# Patient Record
Sex: Female | Born: 1990 | Race: Black or African American | Hispanic: No | Marital: Single | Smoking: Never smoker
Health system: Southern US, Community
[De-identification: ages and names within clinical notes are randomized; demographics above are authoritative.]

## PROBLEM LIST (undated history)

## (undated) DIAGNOSIS — E282 Polycystic ovarian syndrome: Secondary | ICD-10-CM

## (undated) DIAGNOSIS — J45909 Unspecified asthma, uncomplicated: Secondary | ICD-10-CM

## (undated) DIAGNOSIS — T7840XA Allergy, unspecified, initial encounter: Secondary | ICD-10-CM

## (undated) HISTORY — PX: OTHER SURGICAL HISTORY: SHX169

## (undated) HISTORY — DX: Unspecified asthma, uncomplicated: J45.909

## (undated) HISTORY — DX: Allergy, unspecified, initial encounter: T78.40XA

---

## 2015-06-13 ENCOUNTER — Ambulatory Visit (INDEPENDENT_AMBULATORY_CARE_PROVIDER_SITE_OTHER): Payer: Federal, State, Local not specified - PPO | Admitting: Family Medicine

## 2015-06-13 VITALS — BP 130/70 | HR 81 | Temp 99.3°F | Ht 64.0 in | Wt 213.0 lb

## 2015-06-13 DIAGNOSIS — Z3041 Encounter for surveillance of contraceptive pills: Secondary | ICD-10-CM | POA: Diagnosis not present

## 2015-06-13 MED ORDER — NORGESTIM-ETH ESTRAD TRIPHASIC 0.18/0.215/0.25 MG-35 MCG PO TABS: 1.0000 | ORAL_TABLET | Freq: Every day | ORAL | Status: DC

## 2015-06-13 MED ORDER — NORGESTIM-ETH ESTRAD TRIPHASIC 0.18/0.215/0.25 MG-35 MCG PO TABS: 1.0000 | ORAL_TABLET | Freq: Every day | ORAL | Status: AC

## 2015-06-13 NOTE — Progress Notes (Signed)
Birth control pills Subjective:  Patient ID: Angel Mullen, female    DOB: September 22, 1991  Age: 24 y.o. MRN: [id number]  Patient thought she had another pack of birth control pills and will run out this weekend and her physician is not available. She needs a new prescription. She is otherwise healthy. Has a history of some asthma. No other acute problems. She had a question about whether Provera and albuterol inhalers with the same.   Objective:   No acute distress. No thyromegaly. Chest clear. Heart regular without murmurs. Not currently sexually active.  Assessment & Plan:   Assessment:  Oral contraception surveillance  Plan:  Continue the current pills. She will need to follow-up with her primary but I gave her a couple of months refill.  Patient Instructions  Continue current contraception.  Follow-up with your primary care.     Analysa Nutting, MD 06/13/2015

## 2015-06-13 NOTE — Patient Instructions (Signed)
Continue current contraception.  Follow-up with your primary care.

## 2015-08-17 ENCOUNTER — Ambulatory Visit (INDEPENDENT_AMBULATORY_CARE_PROVIDER_SITE_OTHER): Payer: Federal, State, Local not specified - PPO | Admitting: Family Medicine

## 2015-08-17 VITALS — BP 134/68 | HR 86 | Temp 98.6°F | Resp 16 | Ht 60.25 in | Wt 211.2 lb

## 2015-08-17 DIAGNOSIS — J3489 Other specified disorders of nose and nasal sinuses: Secondary | ICD-10-CM | POA: Diagnosis not present

## 2015-08-17 DIAGNOSIS — R059 Cough, unspecified: Secondary | ICD-10-CM

## 2015-08-17 DIAGNOSIS — R05 Cough: Secondary | ICD-10-CM

## 2015-08-17 DIAGNOSIS — J9801 Acute bronchospasm: Secondary | ICD-10-CM | POA: Diagnosis not present

## 2015-08-17 MED ORDER — ALBUTEROL SULFATE HFA 108 (90 BASE) MCG/ACT IN AERS: 2.0000 | INHALATION_SPRAY | Freq: Four times a day (QID) | RESPIRATORY_TRACT | Status: AC | PRN

## 2015-08-17 MED ORDER — AZITHROMYCIN 250 MG PO TABS: ORAL_TABLET | ORAL | Status: DC

## 2015-08-17 MED ORDER — PREDNISONE 20 MG PO TABS: ORAL_TABLET | ORAL | Status: DC

## 2015-08-17 NOTE — Progress Notes (Signed)
[facility] [address], [place] [address] [phone]  Date:  08/17/2015   Name:  Angel DollyCamilla Mcanany   DOB:  Feb 07, 1991   MRN:  [id number]  PCP:  No primary care provider on file.    Chief Complaint: Cough; Nasal Congestion; Sore Throat; and Generalized Body Aches   History of Present Illness:  Angel Mullen is a 24 y.o. very pleasant female patient who presents with the following:  She has noted a cough for about a month, mild ear pains. She has a history of frequent OM Last night she noted a ST and itchy throat, feels like she has some pressure in her sinuses.   She also has noted some aches and chills She continues to have the cough  She has not noted a fever at home No GI symptoms  She has noted some wheezing in the morning and at night; she does have asthma.  She uses singulair and prn albuterol.    She has tried some mucinex yesterday am; no antipyretics today.    LMP about 10 days ago Never a smoker There are no active problems to display for this patient.   Past Medical History  Diagnosis Date  . Allergy   . Asthma     Past Surgical History  Procedure Laterality Date  . 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 Smoking status: Never Smoker   . Smokeless tobacco: None  . Alcohol Use: 2.4 oz/week    4 Standard drinks or equivalent per week    No family history on file.  Allergies  Allergen Reactions  . Amoxicillin Hives and Diarrhea    Medication list has been reviewed and updated.  Current Outpatient Prescriptions on File Prior to Visit  Medication Sig Dispense Refill  . Norgestimate-Ethinyl Estradiol Triphasic (TRINESSA, 28,) 0.18/0.215/0.25 MG-35 MCG tablet Take 1 tablet by mouth daily. 1 Package 2   No current facility-administered medications on file prior to visit.    Review of Systems:  As per HPI- otherwise negative.   Physical Examination: Filed Vitals:   08/17/15 1228   BP: 134/68  Pulse: 86  Temp: 98.6 F (37 C)  Resp: 16   Filed Vitals:   08/17/15 1228  Height: 5' 0.25" (1.53 m)  Weight: 211 lb 3.2 oz (95.8 kg)   Body mass index is 40.92 kg/(m^2). Ideal Body Weight: Weight in (lb) to have BMI = 25: 128.8  GEN: WDWN, NAD, Non-toxic, A & O x 3, overweight, looks well HEENT: Atraumatic, Normocephalic. Neck supple. No masses, No LAD.  Bilateral TM wnl, oropharynx normal.  PEERL,EOMI.   No sinus tenderness to percussion Ears and Nose: No external deformity. CV: RRR, No M/G/R. No JVD. No thrill. No extra heart sounds. PULM: CTA B, no wheezes, crackles, rhonchi. No retractions. No resp. distress. No accessory muscle use. EXTR: No c/c/e NEURO Normal gait.  PSYCH: Normally interactive. Conversant. Not depressed or anxious appearing.  Calm demeanor.    Assessment and Plan: Bronchospasm - Plan: albuterol (PROVENTIL HFA;VENTOLIN HFA) 108 (90 BASE) MCG/ACT inhaler  Cough - Plan: albuterol (PROVENTIL HFA;VENTOLIN HFA) 108 (90 BASE) MCG/ACT inhaler, azithromycin (ZITHROMAX) 250 MG tablet  Sinus pressure - Plan: predniSONE (DELTASONE) 20 MG tablet  Treat for bronchospasm and cough with a course of prednisone If not better soon will use azithromycin Refilled her albuterol She will let me know if not better soon  Signed Abbe AmsterdamJessica Copland, MD

## 2015-08-17 NOTE — Patient Instructions (Signed)
We are going to try a short course of prednisone for your cough and sinus pressure.  Use it for 6 days according to the directions  If this does not work (if you are not improving in a few days) you can also fill and use the azithromycin antibiotic rx Remember that any antibiotic can interfere with your birth control pill  Continue to use your inhaler as needed  Let me know if you are not improving son- Sooner if worse.

## 2015-08-29 ENCOUNTER — Ambulatory Visit (INDEPENDENT_AMBULATORY_CARE_PROVIDER_SITE_OTHER): Payer: Federal, State, Local not specified - PPO | Admitting: Family Medicine

## 2015-08-29 VITALS — BP 120/80 | HR 94 | Temp 99.4°F | Resp 16 | Ht 64.0 in | Wt 208.6 lb

## 2015-08-29 DIAGNOSIS — J209 Acute bronchitis, unspecified: Secondary | ICD-10-CM | POA: Diagnosis not present

## 2015-08-29 MED ORDER — PREDNISONE 5 MG PO TABS
10.0000 mg | ORAL_TABLET | Freq: Every day | ORAL | Status: DC
Start: 2015-08-29 — End: 2015-12-07

## 2015-08-29 NOTE — Patient Instructions (Signed)
Your symptoms and exam are most consistent with an irritant sinus and bronchitis syndrome probably secondary to the poor air quality. We still have good peak flow subtle that you need any more asthma medicine.  Hopefully, women's and climate change in the next week will relieve your symptoms along with the low-dose prednisone.

## 2015-08-29 NOTE — Progress Notes (Signed)
This chart was scribed for Elvina SidleKurt Bedford Winsor, MD by Watt Climesawaa Al Rifaie medical scribe at [facility].The patient was seen in exam room 11 and the patient's care was started at 9:18 AM.  Patient ID: Angel Mullen MRN: [id number], DOB: 1991/09/12, 24 y.o. Date of Encounter: 08/29/2015  Primary Physician: No primary care provider on file.  Chief Complaint:  Chief Complaint  Patient presents with  . Sinus Problem    x 2 days  . Cough    x 1 day  . Sore Throat    x 1 day   . Generalized Body Aches    x last night   . Chills    x last night   . Fatigue    x 1 day  . lack of appetite    x 1 day     HPI:  Angel Mullen is a 24 y.o. female who [facility] complaining of difficulty breathing. Pt states that she presents with breathing difficulty that gradually developed since yesterday, in addition to productive cough, decreased appetite, fatigue, chills, sinus pressure, myalgia, and sore throat that started with sudden onset last night. Pt was treated previously in 11/07 with antibiotics for similar symptoms few days ago. Pt denies pain in the legs, fever, vomiting. Pt does have a hx of Asthma and takes albuterol inhaler when needed.   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   Past Medical History  Diagnosis Date  . Allergy   . Asthma      Home Meds: Prior to Admission medications   Medication Sig Start Date End Date Taking? Authorizing Provider  albuterol (PROVENTIL HFA;VENTOLIN HFA) 108 (90 BASE) MCG/ACT inhaler Inhale 2 puffs into the lungs every 6 (six) hours as needed for wheezing or shortness of breath. Pt prefers pro-air brand 08/17/15  Yes Pearline CablesJessica C Copland, MD  azithromycin (ZITHROMAX) 250 MG tablet Use as a zpack 08/17/15  Yes Jessica C Copland, MD  montelukast (SINGULAIR) 10 MG tablet Take 10 mg by mouth at bedtime.   Yes Historical Provider, MD  Norgestimate-Ethinyl Estradiol  Triphasic (TRINESSA, 28,) 0.18/0.215/0.25 MG-35 MCG tablet Take 1 tablet by mouth daily. 06/13/15  Yes Peyton Najjaravid H Hopper, MD  predniSONE (DELTASONE) 20 MG tablet Take 2 pills a day for 3 days, then 1 pill a day for 3 days Patient not taking: Reported on 08/29/2015 08/17/15   Pearline CablesJessica C Copland, MD    Allergies:  Allergies  Allergen Reactions  . Amoxicillin Hives and Diarrhea    Social History   Social History  . Marital Status: Single    Spouse Name: N/A  . Number of Children: N/A  . Years of Education: N/A   Occupational History  . Not on file.   Social History Main Topics  . Smoking status: Never Smoker   . Smokeless tobacco: Never Used  . Alcohol Use: 2.4 oz/week    4 Standard drinks or equivalent per week  . Drug Use: No  . Sexual Activity: Not on file   Other Topics Concern  . Not on file   Social History Narrative     Review of Systems: Constitutional: negative for fever, night sweats, weight changes. Positive for decreased appetite, fatigue, chills, myalgia. HEENT: negative for vision changes, hearing loss, congestion, rhinorrhea, ST, epistaxis. Positive for sinus pressure, sore throat. Cardiovascular: negative for chest pain or palpitations Respiratory: negative for hemoptysis, wheezing, shortness of breath. Positive for productive cough,  difficulty breathing. Abdominal: negative for abdominal pain, nausea, vomiting, diarrhea, or constipation Dermatological: negative for rash Neurologic: negative for headache, dizziness, or syncope All other systems reviewed and are otherwise negative with the exception to those above and in the HPI.  Physical Exam: Blood pressure 120/80, pulse 94, temperature 99.4 F (37.4 C), temperature source Oral, resp. rate 16, height  (1.626 m), weight 208 lb 9.6 oz (94.62 kg), last menstrual period 08/07/2015, SpO2 94 %., Body mass index is 35.79 kg/(m^2). General: Well developed, well nourished, in no acute distress. Head:  Normocephalic, atraumatic, eyes without discharge, sclera non-icteric, nares are without discharge. Bilateral auditory canals clear, TM's are without perforation, pearly grey and translucent with reflective cone of light bilaterally. Oral cavity moist, posterior pharynx without exudate, erythema, peritonsillar abscess, or post nasal drip. Nasal passages are red and  Neck: Supple. No thyromegaly. Full ROM. No lymphadenopathy. Lungs: Clear bilaterally to auscultation without wheezes, rales, or rhonchi. Breathing is unlabored. Heart: RRR with S1 S2. No murmurs, rubs, or gallops appreciated. Abdomen: Soft, non-tender, non-distended with normoactive bowel sounds. No hepatomegaly. No rebound/guarding. No obvious abdominal masses. Msk:  Strength and tone normal for age. Extremities/Skin: Warm and dry. No clubbing or cyanosis. No edema. No rashes or suspicious lesions. Neuro: Alert and oriented X 3. Moves all extremities spontaneously. Gait is normal. CNII-XII grossly in tact. Psych:  Responds to questions appropriately with a normal affect.   Peak flow 390, predicted 406.    ASSESSMENT AND PLAN:  24 y.o. year old female with     By signing my name below, I, Rawaa Al Rifaie, attest that this documentation has been prepared under the direction and in the presence of Elvina Sidle, MD.  Watt Climes Rifaie, Medical Scribe. 08/29/2015.  9:31 AM.  This chart was scribed in my presence and reviewed by me personally.    ICD-9-CM ICD-10-CM   1. Acute bronchitis, unspecified organism 466.0 J20.9 predniSONE (DELTASONE) 5 MG tablet     Signed, Elvina Sidle, MD  Signed, Elvina Sidle, MD 08/29/2015 9:18 AM

## 2015-08-31 ENCOUNTER — Encounter: Payer: Self-pay | Admitting: Family Medicine

## 2015-09-11 ENCOUNTER — Other Ambulatory Visit: Payer: Self-pay | Admitting: Obstetrics & Gynecology

## 2015-09-11 ENCOUNTER — Other Ambulatory Visit (HOSPITAL_COMMUNITY)
Admission: RE | Admit: 2015-09-11 | Discharge: 2015-09-11 | Disposition: A | Discharge status: Home / Self Care | Payer: Federal, State, Local not specified - PPO | Source: Ambulatory Visit | Attending: Obstetrics & Gynecology | Admitting: Obstetrics & Gynecology

## 2015-09-11 DIAGNOSIS — Z113 Encounter for screening for infections with a predominantly sexual mode of transmission: Secondary | ICD-10-CM | POA: Insufficient documentation

## 2015-09-11 DIAGNOSIS — Z01419 Encounter for gynecological examination (general) (routine) without abnormal findings: Secondary | ICD-10-CM | POA: Insufficient documentation

## 2015-09-14 LAB — CYTOLOGY - PAP

## 2015-12-07 ENCOUNTER — Ambulatory Visit (INDEPENDENT_AMBULATORY_CARE_PROVIDER_SITE_OTHER): Payer: Federal, State, Local not specified - PPO | Admitting: Family Medicine

## 2015-12-07 VITALS — BP 124/84 | HR 76 | Temp 99.1°F | Resp 16 | Ht 64.0 in | Wt 205.0 lb

## 2015-12-07 DIAGNOSIS — J029 Acute pharyngitis, unspecified: Secondary | ICD-10-CM | POA: Diagnosis not present

## 2015-12-07 DIAGNOSIS — Z20828 Contact with and (suspected) exposure to other viral communicable diseases: Secondary | ICD-10-CM

## 2015-12-07 DIAGNOSIS — J039 Acute tonsillitis, unspecified: Secondary | ICD-10-CM

## 2015-12-07 LAB — POCT RAPID STREP A (OFFICE): Rapid Strep A Screen: NEGATIVE

## 2015-12-07 MED ORDER — AZITHROMYCIN 250 MG PO TABS: ORAL_TABLET | ORAL | Status: DC

## 2015-12-07 MED ORDER — METHYLPREDNISOLONE 4 MG PO TBPK: ORAL_TABLET | ORAL | Status: DC

## 2015-12-07 NOTE — Patient Instructions (Signed)

## 2015-12-07 NOTE — Progress Notes (Signed)
 Chief Complaint:  Chief Complaint  Patient presents with  . Nausea  . Sore Throat    Pt states white spots on tonsils  . Asthma  . Ear Fullness    Bilateral    HPI: Angel Mullen is a 25 y.o. female who [facility] to [facility] today complaining of yesterday afternoon noticed spots on tonsils and it is raw, her ears are hurting, her asthma was worse yesterday, nauseated last night and tired .  No fevers or chills. 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 No fevers, chills.   Past Medical History  Diagnosis Date  . Allergy   . Asthma    Past Surgical History  Procedure Laterality Date  . 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 Marital Status: Single    Spouse Name: N/A  . Number of Children: N/A  . Years of Education: N/A   Social History Main Topics  . Smoking status: Never Smoker   . Smokeless tobacco: Never Used  . Alcohol Use: 2.4 oz/week    4 Standard drinks or equivalent per week  . Drug Use: No  . Sexual Activity: Not Asked   Other Topics Concern  . None   Social History Narrative   History reviewed. No pertinent family history. Allergies  Allergen Reactions  . Amoxicillin Hives and Diarrhea   Prior to Admission medications   Medication Sig Start Date End Date Taking? Authorizing Provider  albuterol (PROVENTIL HFA;VENTOLIN HFA) 108 (90 BASE) MCG/ACT inhaler Inhale 2 puffs into the lungs every 6 (six) hours as needed for wheezing or shortness of breath. Pt prefers pro-air brand 08/17/15  Yes Jessica C Copland, MD  montelukast (SINGULAIR) 10 MG tablet Take 10 mg by mouth at bedtime.   Yes Historical Provider, MD  Multiple Vitamins-Minerals (MULTIVITAMIN ADULT PO) Take by mouth.   Yes Historical Provider, MD  Norgestimate-Ethinyl Estradiol Triphasic (TRINESSA, 28,) 0.18/0.215/0.25 MG-35 MCG tablet Take 1 tablet by mouth daily. 06/13/15  Yes Peyton Najjar, MD  azithromycin (ZITHROMAX) 250 MG tablet Use as a  zpack Patient not taking: Reported on 12/07/2015 08/17/15   Pearline Cables, MD  predniSONE (DELTASONE) 5 MG tablet Take 2 tablets (10 mg total) by mouth daily with breakfast. Patient not taking: Reported on 12/07/2015 08/29/15   Elvina Sidle, MD     ROS: The patient denies fevers, chills, night sweats, unintentional weight loss, chest pain, palpitations, wheezing, dyspnea on exertion, nausea, vomiting, abdominal pain, dysuria, hematuria, melena, numbness,  or tingling.   All other systems have been reviewed and were otherwise negative with the exception of those mentioned in the HPI and as above.    PHYSICAL EXAM: Filed Vitals:   12/07/15 1150  BP: 124/84  Pulse: 76  Temp: 99.1 F (37.3 C)  Resp: 16   Body mass index is 35.17 kg/(m^2).   General: Alert, no acute distress HEENT:  Normocephalic, atraumatic, oropharynx patent. EOMI, PERRLA Tm bulging but no infection, + tonsil erythema and enlargement and + exudates Cardiovascular:  Regular rate and rhythm, no rubs murmurs or gallops.  Respiratory: Clear to auscultation bilaterally.  No wheezes, rales, or rhonchi.  No cyanosis, no use of accessory musculature Abdominal: No organomegaly, abdomen is soft and non-tender, positive bowel sounds. No masses. Skin: No rashes. Neurologic: Facial musculature symmetric. Psychiatric: Patient acts appropriately throughout our interaction. Lymphatic: No cervical or submandibular lymphadenopathy Musculoskeletal: Gait intact. No edema, tenderness  LABS: Results for orders placed or performed in visit on 12/07/15  POCT rapid strep A  Result Value Ref Range   Rapid Strep A Screen Negative Negative     EKG/XRAY:   Primary read interpreted by Dr. Conley Rolls at [facility].   ASSESSMENT/PLAN: Encounter Diagnoses  Name Primary?  . Acute pharyngitis, unspecified pharyngitis type Yes  . Acute tonsillitis, unspecified etiology   . Mono exposure    Rx azithromycin EBV and also throat cx pending Rx  medrol dose pack  Fu prn   Gross sideeffects, risk and benefits, and alternatives of medications d/w patient. Patient is aware that all medications have potential sideeffects and we are unable to predict every sideeffect or drug-drug interaction that may occur.    DO  12/07/2015 2:28 PM

## 2015-12-08 LAB — EPSTEIN-BARR VIRUS VCA ANTIBODY PANEL
EBV EA IgG: <5 U/mL (ref <9.0)
EBV NA IgG: 505 U/mL — ABNORMAL HIGH (ref <18.0)
EBV VCA IgG: 67.6 U/mL — ABNORMAL HIGH (ref <18.0)
EBV VCA IgM: <10 U/mL (ref <36.0)

## 2015-12-09 LAB — CULTURE, GROUP A STREP

## 2016-02-26 ENCOUNTER — Ambulatory Visit (INDEPENDENT_AMBULATORY_CARE_PROVIDER_SITE_OTHER): Payer: Federal, State, Local not specified - PPO | Admitting: Physician Assistant

## 2016-02-26 ENCOUNTER — Ambulatory Visit (INDEPENDENT_AMBULATORY_CARE_PROVIDER_SITE_OTHER): Payer: Federal, State, Local not specified - PPO

## 2016-02-26 VITALS — BP 130/82 | HR 81 | Temp 98.9°F | Resp 16 | Ht 64.75 in | Wt 207.6 lb

## 2016-02-26 DIAGNOSIS — R0789 Other chest pain: Secondary | ICD-10-CM

## 2016-02-26 LAB — POCT CBC
GRANULOCYTE PERCENT: 67.4 % (ref 37–80)
HEMATOCRIT: 40.8 % (ref 37.7–47.9)
HEMOGLOBIN: 14.9 g/dL (ref 12.2–16.2)
Lymph, poc: 2.5 (ref 0.6–3.4)
MCH: 29.6 pg (ref 27–31.2)
MCHC: 36.4 g/dL — AB (ref 31.8–35.4)
MCV: 81.2 fL (ref 80–97)
MID (CBC): 0.6 (ref 0–0.9)
MPV: 7.4 fL (ref 0–99.8)
POC GRANULOCYTE: 6.5 (ref 2–6.9)
POC LYMPH PERCENT: 25.9 %L (ref 10–50)
POC MID %: 6.7 % (ref 0–12)
Platelet Count, POC: 272 10*3/uL (ref 142–424)
RBC: 5.02 M/uL (ref 4.04–5.48)
RDW, POC: 13 %
WBC: 9.7 10*3/uL (ref 4.6–10.2)

## 2016-02-26 LAB — COMPLETE METABOLIC PANEL WITH GFR
ALT: 13 U/L (ref 6–29)
AST: 15 U/L (ref 10–30)
Albumin: 4.1 g/dL (ref 3.6–5.1)
Alkaline Phosphatase: 53 U/L (ref 33–115)
BUN: 11 mg/dL (ref 7–25)
CALCIUM: 9.5 mg/dL (ref 8.6–10.2)
CHLORIDE: 103 mmol/L (ref 98–110)
CO2: 24 mmol/L (ref 20–31)
Creat: 0.7 mg/dL (ref 0.50–1.10)
GFR, Est African American: >89 mL/min (ref >=60)
GFR, Est Non African American: >89 mL/min (ref >=60)
Glucose, Bld: 81 mg/dL (ref 65–99)
POTASSIUM: 4.6 mmol/L (ref 3.5–5.3)
Sodium: 139 mmol/L (ref 135–146)
Total Bilirubin: 0.5 mg/dL (ref 0.2–1.2)
Total Protein: 7.1 g/dL (ref 6.1–8.1)

## 2016-02-26 LAB — TSH: TSH: 2.48 mIU/L

## 2016-02-26 NOTE — Progress Notes (Signed)
02/26/2016 4:59 PM   DOB: [birth year] / MRN: [id number]  SUBJECTIVE:  Angel Mullen is a 25 y.o. female with a history of anxiety presenting for multiple transient episodes of substernal chest pressure that started 3 days ago.  Reports the episode last anywhere from 10 seconds to 30 minutes and complains that he can feel her heart rapidly contracting during these episodes.  She denies diaphresis, SOB, new DOE, and radiation of the pain.  She has a history of asthma and reports this is well controlled at this time.   She is allergic to amoxicillin.   She  has a past medical history of Allergy and Asthma.    She  reports that she has never smoked. She has never used smokeless tobacco. She reports that she drinks about 2.4 oz of alcohol per week. She reports that she does not use illicit drugs. She  has no sexual activity history on file. The patient  has past surgical history that includes pilondial cyst on tailbone.  Her family history is not on file.  Review of Systems  Constitutional: Negative for fever and chills.  Respiratory: Negative for cough.   Cardiovascular: Positive for palpitations. Negative for chest pain and leg swelling.  Gastrointestinal: Negative for nausea.  Genitourinary: Negative for dysuria.  Musculoskeletal: Negative for myalgias.  Skin: Negative for rash.  Neurological: Negative for dizziness and headaches.    Problem list and medications reviewed and updated by myself where necessary, and exist elsewhere in the encounter.   OBJECTIVE:  BP 130/82 mmHg  Pulse 81  Temp(Src) 98.9 F (37.2 C) (Oral)  Resp 16  Ht 5' 4.75" (1.645 m)  Wt 207 lb 9.6 oz (94.167 kg)  BMI 34.80 kg/m2  SpO2 98%  LMP 02/21/2016  Physical Exam  Constitutional: She is oriented to person, place, and time. She appears well-nourished. No distress.  Eyes: EOM are normal. Pupils are equal, round, and reactive to light.  Cardiovascular: Normal rate, regular rhythm, S1 normal, S2  normal and normal heart sounds.   Negative for chest tenderness  Pulmonary/Chest: Effort normal and breath sounds normal. She has no wheezes. She has no rales.  Abdominal: She exhibits no distension.  Neurological: She is alert and oriented to person, place, and time. No cranial nerve deficit. Gait normal.  Skin: Skin is dry. She is not diaphoretic.  Psychiatric: She has a normal mood and affect.  Vitals reviewed.   Results for orders placed or performed in visit on 02/26/16 (from the past 72 hour(s))  POCT CBC     Status: Abnormal   Collection Time: 02/26/16  4:44 PM  Result Value Ref Range   WBC 9.7 4.6 - 10.2 K/uL   Lymph, poc 2.5 0.6 - 3.4   POC LYMPH PERCENT 25.9 10 - 50 %L   MID (cbc) 0.6 0 - 0.9   POC MID % 6.7 0 - 12 %M   POC Granulocyte 6.5 2 - 6.9   Granulocyte percent 67.4 37 - 80 %G   RBC 5.02 4.04 - 5.48 M/uL   Hemoglobin 14.9 12.2 - 16.2 g/dL   HCT, POC 40.940.8 81.137.7 - 47.9 %   MCV 81.2 80 - 97 fL   MCH, POC 29.6 27 - 31.2 pg   MCHC 36.4 (A) 31.8 - 35.4 g/dL   RDW, POC 91.413.0 %   Platelet Count, POC 272 142 - 424 K/uL   MPV 7.4 0 - 99.8 fL   EKG: NSR.  Horizontal axis, negative for ischemia  and infarct.   Dg Chest 2 View  02/26/2016  CLINICAL DATA:  Chest pressure EXAM: CHEST  2 VIEW COMPARISON:  None. FINDINGS: The heart size and mediastinal contours are within normal limits. Both lungs are clear. The visualized skeletal structures are unremarkable. IMPRESSION: No active cardiopulmonary disease. Electronically Signed   By: Marlan Palau M.D.   On: 02/26/2016 16:50    ASSESSMENT AND PLAN  Angel Mullen was seen today for chest pain.  Diagnoses and all orders for this visit:  Chest pressure: Given that her pain is transient I think she is likely having palpitations. She denies diaphoresis, radiation of the pain, SOB and any new DOE.  Will get her into cards in a week or two for further work up.  Her heart dose appear to be slightly enlarged to me.  Will screen a BNP.   -      EKG 12-Lead -     DG Chest 2 View; Future -     POCT CBC -     TSH -     COMPLETE METABOLIC PANEL WITH GFR -     Brain natriuretic peptide -     AMB cards    The patient was advised to call or return to clinic if she does not see an improvement in symptoms or to seek the care of the closest emergency department if she worsens with the above plan.   Deliah Boston, MHS, PA-C [facility] 02/26/2016 4:59 PM

## 2016-02-27 LAB — BRAIN NATRIURETIC PEPTIDE: Brain Natriuretic Peptide: 13.5 pg/mL (ref <100)

## 2016-02-29 ENCOUNTER — Telehealth: Payer: Self-pay

## 2016-02-29 NOTE — Telephone Encounter (Signed)
Patient is calling because she's trying to see if she can get a referral appointment sooner. She's having more chest pains and is flying out of town this Friday. Patient states the [facility] advised that she can only get an appointment this week it's sent as a stat or asap referral. Patient wants to know if we can make that possible. Please advise!  [phone]

## 2016-03-01 ENCOUNTER — Emergency Department (HOSPITAL_COMMUNITY): Payer: Federal, State, Local not specified - PPO

## 2016-03-01 ENCOUNTER — Encounter (HOSPITAL_COMMUNITY): Payer: Self-pay | Admitting: Emergency Medicine

## 2016-03-01 ENCOUNTER — Emergency Department (HOSPITAL_COMMUNITY)
Admission: EM | Admit: 2016-03-01 | Discharge: 2016-03-01 | Disposition: A | Discharge status: Home / Self Care | Payer: Federal, State, Local not specified - PPO | Attending: Emergency Medicine | Admitting: Emergency Medicine

## 2016-03-01 DIAGNOSIS — J45909 Unspecified asthma, uncomplicated: Secondary | ICD-10-CM | POA: Insufficient documentation

## 2016-03-01 DIAGNOSIS — R0789 Other chest pain: Secondary | ICD-10-CM | POA: Diagnosis present

## 2016-03-01 DIAGNOSIS — Z79899 Other long term (current) drug therapy: Secondary | ICD-10-CM | POA: Insufficient documentation

## 2016-03-01 HISTORY — DX: Polycystic ovarian syndrome: E28.2

## 2016-03-01 LAB — BASIC METABOLIC PANEL
Anion gap: 9 (ref 5–15)
BUN: 10 mg/dL (ref 6–20)
CHLORIDE: 102 mmol/L (ref 101–111)
CO2: 25 mmol/L (ref 22–32)
CREATININE: 0.71 mg/dL (ref 0.44–1.00)
Calcium: 9.3 mg/dL (ref 8.9–10.3)
GFR calc non Af Amer: >60 mL/min (ref >60)
GLUCOSE: 101 mg/dL — AB (ref 65–99)
Potassium: 3.8 mmol/L (ref 3.5–5.1)
Sodium: 136 mmol/L (ref 135–145)

## 2016-03-01 LAB — CBC WITH DIFFERENTIAL/PLATELET
BASOS PCT: 0 %
Basophils Absolute: 0 10*3/uL (ref 0.0–0.1)
Eosinophils Absolute: 0.2 10*3/uL (ref 0.0–0.7)
Eosinophils Relative: 2 %
HEMATOCRIT: 41 % (ref 36.0–46.0)
HEMOGLOBIN: 14.5 g/dL (ref 12.0–15.0)
LYMPHS ABS: 2.3 10*3/uL (ref 0.7–4.0)
LYMPHS PCT: 22 %
MCH: 28.5 pg (ref 26.0–34.0)
MCHC: 35.4 g/dL (ref 30.0–36.0)
MCV: 80.7 fL (ref 78.0–100.0)
MONO ABS: 0.9 10*3/uL (ref 0.1–1.0)
MONOS PCT: 8 %
NEUTROS ABS: 7 10*3/uL (ref 1.7–7.7)
NEUTROS PCT: 68 %
Platelets: 298 10*3/uL (ref 150–400)
RBC: 5.08 MIL/uL (ref 3.87–5.11)
RDW: 12.5 % (ref 11.5–15.5)
WBC: 10.3 10*3/uL (ref 4.0–10.5)

## 2016-03-01 LAB — I-STAT TROPONIN, ED: Troponin i, poc: 0 ng/mL (ref 0.00–0.08)

## 2016-03-01 LAB — POC URINE PREG, ED: PREG TEST UR: NEGATIVE

## 2016-03-01 LAB — D-DIMER, QUANTITATIVE: D-Dimer, Quant: 0.74 ug/mL-FEU — ABNORMAL HIGH (ref 0.00–0.50)

## 2016-03-01 MED ORDER — KETOROLAC TROMETHAMINE 30 MG/ML IJ SOLN
30.0000 mg | Freq: Once | INTRAMUSCULAR | Status: AC
  Administered 2016-03-01: 30 mg via INTRAVENOUS
  Filled 2016-03-01: qty 1

## 2016-03-01 MED ORDER — HYDROXYZINE HCL 25 MG PO TABS
25.0000 mg | ORAL_TABLET | Freq: Once | ORAL | Status: AC
  Administered 2016-03-01: 25 mg via ORAL
  Filled 2016-03-01: qty 1

## 2016-03-01 MED ORDER — IOPAMIDOL (ISOVUE-370) INJECTION 76%
100.0000 mL | Freq: Once | INTRAVENOUS | Status: AC | PRN
  Administered 2016-03-01: 100 mL via INTRAVENOUS

## 2016-03-01 NOTE — ED Notes (Signed)
Bed: WA08 Expected date:  Expected time:  Means of arrival:  Comments: Hold for fast track 

## 2016-03-01 NOTE — ED Provider Notes (Signed)
MSE was initiated and I personally evaluated the patient and placed orders (if any) at  11:08 PM on Mar 01, 2016.  Pt signed out to me at shift change pending CT with PE study. The patient is an otherwise healthy 25 year old female who presents to the ED complaining of ongoing and worsening chest pain over the last 2 weeks. Patient had a positive d-dimer at 0.74. CT PE study obtained which was negative for pulmonary embolism. It did show some air-fluid levels around the esophagus suggesting GERD. Patient has scheduled follow-up with a cardiologist next week. She is low suspicion for ACS. No cardiac risk factors. Will DC home with symptomatic treatment. Recommend NSAIDs.  The patient appears stable so that the remainder of the MSE may be completed by another provider.  Lester KinsmanSamantha Tripp MedleyDowless, PA-C 03/01/16 2310  Mancel BaleElliott Wentz, MD 03/02/16 [phone]

## 2016-03-01 NOTE — Discharge Instructions (Signed)
For pain control please take ibuprofen (also known as Motrin or Advil) 800mg  (this is normally 4 over the counter pills) 3 times a day  for 5 days. Take with food to minimize stomach irritation.  Please follow with your primary care doctor in the next 2 days for a check-up. They must obtain records for further management.   Do not hesitate to return to the Emergency Department for any new, worsening or concerning symptoms.    Nonspecific Chest Pain  Chest pain can be caused by many different conditions. There is always a chance that your pain could be related to something serious, such as a heart attack or a blood clot in your lungs. Chest pain can also be caused by conditions that are not life-threatening. If you have chest pain, it is very important to follow up with your health care provider. CAUSES  Chest pain can be caused by:  Heartburn.  Pneumonia or bronchitis.  Anxiety or stress.  Inflammation around your heart (pericarditis) or lung (pleuritis or pleurisy).  A blood clot in your lung.  A collapsed lung (pneumothorax). It can develop suddenly on its own (spontaneous pneumothorax) or from trauma to the chest.  Shingles infection (varicella-zoster virus).  Heart attack.  Damage to the bones, muscles, and cartilage that make up your chest wall. This can include:  Bruised bones due to injury.  Strained muscles or cartilage due to frequent or repeated coughing or overwork.  Fracture to one or more ribs.  Sore cartilage due to inflammation (costochondritis). RISK FACTORS  Risk factors for chest pain may include:  Activities that increase your risk for trauma or injury to your chest.  Respiratory infections or conditions that cause frequent coughing.  Medical conditions or overeating that can cause heartburn.  Heart disease or family history of heart disease.  Conditions or health behaviors that increase your risk of developing a blood clot.  Having had chicken pox  (varicella zoster). SIGNS AND SYMPTOMS Chest pain can feel like:  Burning or tingling on the surface of your chest or deep in your chest.  Crushing, pressure, aching, or squeezing pain.  Dull or sharp pain that is worse when you move, cough, or take a deep breath.  Pain that is also felt in your back, neck, shoulder, or arm, or pain that spreads to any of these areas. Your chest pain may come and go, or it may stay constant. DIAGNOSIS Lab tests or other studies may be needed to find the cause of your pain. Your health care provider may have you take a test called an ambulatory ECG (electrocardiogram). An ECG records your heartbeat patterns at the time the test is performed. You may also have other tests, such as:  Transthoracic echocardiogram (TTE). During echocardiography, sound waves are used to create a picture of all of the heart structures and to look at how blood flows through your heart.  Transesophageal echocardiogram (TEE).This is a more advanced imaging test that obtains images from inside your body. It allows your health care provider to see your heart in finer detail.  Cardiac monitoring. This allows your health care provider to monitor your heart rate and rhythm in real time.  Holter monitor. This is a portable device that records your heartbeat and can help to diagnose abnormal heartbeats. It allows your health care provider to track your heart activity for several days, if needed.  Stress tests. These can be done through exercise or by taking medicine that makes your heart beat more  quickly.  Blood tests.  Imaging tests. TREATMENT  Your treatment depends on what is causing your chest pain. Treatment may include:  Medicines. These may include:  Acid blockers for heartburn.  Anti-inflammatory medicine.  Pain medicine for inflammatory conditions.  Antibiotic medicine, if an infection is present.  Medicines to dissolve blood clots.  Medicines to treat coronary  artery disease.  Supportive care for conditions that do not require medicines. This may include:  Resting.  Applying heat or cold packs to injured areas.  Limiting activities until pain decreases. HOME CARE INSTRUCTIONS  If you were prescribed an antibiotic medicine, finish it all even if you start to feel better.  Avoid any activities that bring on chest pain.  Do not use any tobacco products, including cigarettes, chewing tobacco, or electronic cigarettes. If you need help quitting, ask your health care provider.  Do not drink alcohol.  Take medicines only as directed by your health care provider.  Keep all follow-up visits as directed by your health care provider. This is important. This includes any further testing if your chest pain does not go away.  If heartburn is the cause for your chest pain, you may be told to keep your head raised (elevated) while sleeping. This reduces the chance that acid will go from your stomach into your esophagus.  Make lifestyle changes as directed by your health care provider. These may include:  Getting regular exercise. Ask your health care provider to suggest some activities that are safe for you.  Eating a heart-healthy diet. A registered dietitian can help you to learn healthy eating options.  Maintaining a healthy weight.  Managing diabetes, if necessary.  Reducing stress. SEEK MEDICAL CARE IF:  Your chest pain does not go away after treatment.  You have a rash with blisters on your chest.  You have a fever. SEEK IMMEDIATE MEDICAL CARE IF:   Your chest pain is worse.  You have an increasing cough, or you cough up blood.  You have severe abdominal pain.  You have severe weakness.  You faint.  You have chills.  You have sudden, unexplained chest discomfort.  You have sudden, unexplained discomfort in your arms, back, neck, or jaw.  You have shortness of breath at any time.  You suddenly start to sweat, or your  skin gets clammy.  You feel nauseous or you vomit.  You suddenly feel light-headed or dizzy.  Your heart begins to beat quickly, or it feels like it is skipping beats. These symptoms may represent a serious problem that is an emergency. Do not wait to see if the symptoms will go away. Get medical help right away. Call your local emergency services (911 in the [place] Do not drive yourself to the hospital.   This information is not intended to replace advice given to you by your health care provider. Make sure you discuss any questions you have with your health care provider.  Your CT today did not show any evidence of a pulmonary embolism. There are no blood clots in your lungs. Keep your scheduled with your cardiologist. Take ibuprofen for pain. Return to the ER if you experience severe worsening of her symptoms, difficulty breathing, loss of consciousness, dizziness, weakness, fevers, chills.

## 2016-03-01 NOTE — ED Notes (Signed)
Pt transported to CT ?

## 2016-03-01 NOTE — ED Provider Notes (Signed)
CSN: [id number]     Arrival date & time 03/01/16  1934 History   By signing my name below, I, Marisue Humble, attest that this documentation has been prepared under the direction and in the presence of non-physician practitioner, Wynetta Emery, PA-C. Electronically Signed: Marisue Humble, Scribe. 03/01/2016. 8:48 PM.   Chief Complaint  Patient presents with  . Asthma    The history is provided by the patient. No language interpreter was used.   HPI Comments:  Angel Mullen is a 25 y.o. female with PMHx of asthma and PCOS who presents to the Emergency Department complaining of worsening central chest pain and pressure currently radiating to her back for the past 5 days. Initially the chest pain occurred intermittently after exertion or twisting, but has become constant in the past 2 days; she notes pain worsens and becomes sharp with deep breath. Pt reports associated ear pain, rhinorrhea, and sore throat onset today. Pt also notes intermittent cough at night. No alleviating factors noted or treatments attempted PTA. Pt states current symptoms do not feel like past asthma exacerbations. Pt takes orthotrycline. She took 2x 1 hour flights to [place] 2 weeks ago but denies any other long travel. Pt has an appointment with a cardiologist in a week and was seen by Urgent Care recently. Denies fever, FHx of early cardiac death, cocaine use, methamphetamine use, h/o smoking, h/o blood clots, leg swelling, or calf pain.   Past Medical History  Diagnosis Date  . Allergy   . Asthma   . PCOS (polycystic ovarian syndrome)    Past Surgical History  Procedure Laterality Date  . Pilondial cyst on tailbone    . Extraction of wisdom teeth     Family History  Problem Relation Age of Onset  . Cancer Other   . Hypertension Other   . Diabetes Other   . Stroke Other    Social History  Substance Use Topics  . Smoking status: Never Smoker   . Smokeless tobacco: Never Used  . Alcohol Use: 2.4  oz/week    4 Standard drinks or equivalent per week   OB History    No data available     Review of Systems  10 systems reviewed and all are negative for acute change except as noted in the HPI.   Allergies  Amoxicillin  Home Medications   Prior to Admission medications   Medication Sig Start Date End Date Taking? Authorizing Provider  albuterol (PROVENTIL HFA;VENTOLIN HFA) 108 (90 BASE) MCG/ACT inhaler Inhale 2 puffs into the lungs every 6 (six) hours as needed for wheezing or shortness of breath. Pt prefers pro-air brand 08/17/15   Pearline Cables, MD  methylPREDNISolone (MEDROL DOSEPAK) 4 MG TBPK tablet Take as directed Patient not taking: Reported on 02/26/2016 12/07/15   Thao P Le, DO  montelukast (SINGULAIR) 10 MG tablet Take 10 mg by mouth at bedtime.    Historical Provider, MD  Multiple Vitamins-Minerals (MULTIVITAMIN ADULT PO) Take by mouth.    Historical Provider, MD  Norgestimate-Ethinyl Estradiol Triphasic (TRINESSA, 28,) 0.18/0.215/0.25 MG-35 MCG tablet Take 1 tablet by mouth daily. 06/13/15   Peyton Najjar, MD   BP 136/97 mmHg  Pulse 100  Temp(Src) 98.6 F (37 C) (Oral)  Resp 20  Ht  (1.626 m)  Wt 92.987 kg  BMI 35.17 kg/m2  SpO2 96%  LMP 02/16/2016 (Exact Date) Physical Exam  Constitutional: She is oriented to person, place, and time. She appears well-developed and well-nourished. No distress.  HENT:  Head: Normocephalic and atraumatic.  Right Ear: External ear normal.  Left Ear: External ear normal.  Mouth/Throat: Oropharynx is clear and moist. No oropharyngeal exudate.  No drooling or stridor. Posterior pharynx mildly erythematous no significant tonsillar hypertrophy. No exudate. Soft palate rises symmetrically. No TTP or induration under tongue.   No tenderness to palpation of frontal or bilateral maxillary sinuses.  Mild mucosal edema in the nares with scant rhinorrhea.  Bilateral tympanic membranes with normal architecture and good light  reflex.    Eyes: Conjunctivae and EOM are normal. Pupils are equal, round, and reactive to light.  Neck: Normal range of motion. Neck supple. No JVD present. No tracheal deviation present.  Cardiovascular: Normal rate, regular rhythm and intact distal pulses.   Radial pulse equal bilaterally  Pulmonary/Chest: Effort normal and breath sounds normal. No stridor. No respiratory distress. She has no wheezes. She has no rales. She exhibits no tenderness.  Abdominal: Soft. She exhibits no distension and no mass. There is no tenderness. There is no rebound and no guarding.  Musculoskeletal: Normal range of motion. She exhibits no edema or tenderness.  No calf asymmetry, superficial collaterals, palpable cords, edema, Homans sign negative bilaterally.    Neurological: She is alert and oriented to person, place, and time.  Skin: Skin is warm. She is not diaphoretic.  Psychiatric: She has a normal mood and affect.  Nursing note and vitals reviewed.   ED Course  Procedures  DIAGNOSTIC STUDIES:  Oxygen Saturation is 96% on RA, normal by my interpretation.    COORDINATION OF CARE:  8:42 PM Will order chest x-ray and d-dimer. Discussed treatment plan with pt at bedside and pt agreed to plan.  Labs Review Labs Reviewed  CBC WITH DIFFERENTIAL/PLATELET  D-DIMER, QUANTITATIVE (NOT AT [facility])  BASIC METABOLIC PANEL  I-STAT TROPOININ, ED  POC URINE PREG, ED    Imaging Review No results found. I have personally reviewed and evaluated these images and lab results as part of my medical decision-making.   EKG Interpretation None      MDM   Final diagnoses:  None    Filed Vitals:   03/01/16 2012  BP: 136/97  Pulse: 100  Temp: 98.6 F (37 C)  TempSrc: Oral  Resp: 20  Height: 5\' 4"  (1.626 m)  Weight: 92.987 kg  SpO2: 96%    Medications  ketorolac (TORADOL) 30 MG/ML injection 30 mg (not administered)    Angel Mullen is 25 y.o. female presenting with worsening chest  pain, pleuritic in nature. States this is fundamentally dissimilar from her typical chest tightness when she has asthma, her lung sounds are clear to auscultation there is excellent air movement in all fields. Patient was seen for similar at urgent care, states that the pain is getting significantly worse. Chest pain is not reproducible to palpation. She is low risk by HEAR score. She has an appointment set up to be evaluated by cardiology but that's not for 2 weeks. Patient is not Tachycardic, she saturating well on room air, physical exam is not consistent with DVT however, given the unexplained nature of her pain and oral contraceptive use will check a d-dimer. [facility] shift change: Plan is to follow-up bloodwork, chest x-ray, EKG and d-dimer.       Wynetta Emeryicole Rees Santistevan, PA-C 03/01/16 [id number]  Wynetta EmeryNicole Taylar Hartsough, PA-C 03/01/16 [id number]  Loren Raceravid Yelverton, MD 03/01/16 2217

## 2016-03-01 NOTE — ED Notes (Signed)
Pt states for the past 5 days she has been having chest tightness that started only on exertion but has got more constant over the past 2 days Pt states she has a hx of asthma  Pt states today she developed flu like sxs like scratchy throat, runny nose, ear pain  Pt states she has had a mild cough for the past couple of days

## 2016-03-03 NOTE — Telephone Encounter (Signed)
Pt went to the emergency room 

## 2016-04-26 ENCOUNTER — Ambulatory Visit (INDEPENDENT_AMBULATORY_CARE_PROVIDER_SITE_OTHER): Payer: Federal, State, Local not specified - PPO | Admitting: Family Medicine

## 2016-04-26 VITALS — BP 126/80 | HR 81 | Temp 98.2°F | Resp 16 | Ht 65.0 in | Wt 210.0 lb

## 2016-04-26 DIAGNOSIS — Z872 Personal history of diseases of the skin and subcutaneous tissue: Secondary | ICD-10-CM

## 2016-04-26 DIAGNOSIS — T148XXA Other injury of unspecified body region, initial encounter: Secondary | ICD-10-CM

## 2016-04-26 NOTE — Patient Instructions (Addendum)
     IF you received an x-ray today, you will receive an invoice from [facility]. Please contact [facility] at [phone] with questions or concerns regarding your invoice.   IF you received labwork today, you will receive an invoice from [facility]. Please contact [facility] at [phone] with questions or concerns regarding your invoice.   Our billing staff will not be able to assist you with questions regarding bills from these companies.  You will be contacted with the lab results as soon as they are available. The fastest way to get your results is to activate your My Chart account. Instructions are located on the last page of this paperwork. If you have not heard from us regarding the results in 2 weeks, please contact this office.    I do not see any obvious pilonidal cyst or abscess currently. There is a small amount of wet appearing skin below the previous scar but no active bleeding at this time. You can do try warm soaks as needed if the area swells or is sore, but if any increased swelling, pain, or bleeding, return for recheck. Keep area clean with soap and water and try to dry thoroughly after bathing. Let me know if you have questions in the meantime.

## 2016-04-26 NOTE — Progress Notes (Signed)
By signing my name below, I, Mesha Guinyard, attest that this documentation has been prepared under the direction and in the presence of Meredith StaggersJeffrey Lenyx Boody, MD.  Electronically Signed: Arvilla MarketMesha Guinyard, Medical Scribe. 04/26/2016. 12:19 PM.  Subjective:    Patient ID: Angel Mullen, female    DOB: 03-Sep-1991, 25 y.o.   MRN: [id number]  HPI Chief Complaint  Patient presents with  . Cyst    Location: Buttock    HPI Comments: Angel Mullen is a 25 y.o. female who presents to the [facility] complaining of cyst on buttock onset 2 days ago. Pt mentions there was a little bit of a bloody discharge from the area that occurred once 2 days ago. Pt reports there is some redness in the area. Pt states she feels some discomfort, some itchiness, and some moisture in the area. Pt has been packing, moving, and picking up heavy objects. Pt has had some increased swimming in the past couple of weeks. Pt had surgery on her cyst after she fell on it 3 times from slippery ice in Feb 2008. Pt states since then, the area has always been tender. Pt states it took a long time to heal after the surgery. Pt thought it opened up again in [facility] and she was given abx for it- no I&D needed. Pt denies rash, wound, and increased pain in the area.  There are no active problems to display for this patient.  Past Medical History  Diagnosis Date  . Allergy   . Asthma   . PCOS (polycystic ovarian syndrome)    Past Surgical History  Procedure Laterality Date  . Pilondial cyst on tailbone    . Extraction of wisdom teeth     Allergies  Allergen Reactions  . Amoxicillin Hives and Diarrhea    Has patient had a PCN reaction causing immediate rash, facial/tongue/throat swelling, SOB or lightheadedness with hypotension: Yes Has patient had a PCN reaction causing severe rash involving mucus membranes or skin necrosis: No Has patient had a PCN reaction that required  hospitalization No Has patient had a PCN reaction occurring within the last 10 years: No If all of the above answers are "NO", then may proceed with Cephalosporin use.    Prior to Admission medications   Medication Sig Start Date End Date Taking? Authorizing Provider  albuterol (PROVENTIL HFA;VENTOLIN HFA) 108 (90 BASE) MCG/ACT inhaler Inhale 2 puffs into the lungs every 6 (six) hours as needed for wheezing or shortness of breath. Pt prefers pro-air brand 08/17/15  Yes Gwenlyn FoundJessica C Copland, MD  cetirizine (ZYRTEC) 10 MG tablet Take 10 mg by mouth daily.   Yes Historical Provider, MD  montelukast (SINGULAIR) 10 MG tablet Take 10 mg by mouth at bedtime.   Yes Historical Provider, MD  Multiple Vitamins-Minerals (MULTIVITAMIN ADULT PO) Take 1 tablet by mouth daily.    Yes Historical Provider, MD  Norgestimate-Ethinyl Estradiol Triphasic (TRINESSA, 28,) 0.18/0.215/0.25 MG-35 MCG tablet Take 1 tablet by mouth daily. 06/13/15  Yes Peyton Najjaravid H Hopper, MD   Social History   Social History  . Marital Status: Single    Spouse Name: N/A  . Number of Children: N/A  . Years of Education: N/A   Occupational History  . Not on file.   Social History Main Topics  . Smoking status: Never Smoker   . Smokeless tobacco: Never Used  . Alcohol Use: 2.4 oz/week    4 Standard drinks or equivalent per week  .  Drug Use: No  . Sexual Activity: Not on file   Other Topics Concern  . Not on file   Social History Narrative   Review of Systems  Skin: Positive for color change. Negative for rash and wound.    Objective:  BP 126/80 mmHg  Pulse 81  Temp(Src) 98.2 F (36.8 C) (Oral)  Resp 16  Ht  (1.651 m)  Wt 210 lb (95.255 kg)  BMI 34.95 kg/m2  SpO2 98%  LMP 04/18/2016  Physical Exam  Constitutional: She appears well-developed and well-nourished. No distress.  HENT:  Head: Normocephalic and atraumatic.  Eyes: Conjunctivae are normal.  Neck: Neck supple.  Cardiovascular: Normal rate.     Pulmonary/Chest: Effort normal.  Neurological: She is alert.  Skin: Skin is warm and dry.  In the upper cleft theres minimal wet appearance but no discharge, induration, or abscess visualized. There is a well healed scar superior to the affected area  Psychiatric: She has a normal mood and affect. Her behavior is normal.  Nursing note and vitals reviewed.  Assessment & Plan:  Angel Mullen is a 25 y.o. female History of pilonidal cyst  Abrasion of skin  No apparent pilonidal abscess or visible cyst at this time. Some scar tissue inferior to previous wound, with slight invagination of upper cleft skin, but base was visualized. Minimal wet appearance in area, but no active bleeding or wounds. She may have had a split to that area or abrasion, but no concerning findings on exam at present. Symptomatic care, RTC precautions were discussed.  No orders of the defined types were placed in this encounter.   Patient Instructions       IF you received an x-ray today, you will receive an invoice from [facility]. Please contact [facility] at [phone] with questions or concerns regarding your invoice.   IF you received labwork today, you will receive an invoice from [facility]. Please contact [facility] at [phone] with questions or concerns regarding your invoice.   Our billing staff will not be able to assist you with questions regarding bills from these companies.  You will be contacted with the lab results as soon as they are available. The fastest way to get your results is to activate your My Chart account. Instructions are located on the last page of this paperwork. If you have not heard from [place] regarding the results in 2 weeks, please contact this office.    I do not see any obvious pilonidal cyst or abscess currently. There is a small amount of wet appearing skin below the previous scar but no active bleeding at this time. You can  do try warm soaks as needed if the area swells or is sore, but if any increased swelling, pain, or bleeding, return for recheck. Keep area clean with soap and water and try to dry thoroughly after bathing. Let me know if you have questions in the meantime.    I personally performed the services described in this documentation, which was scribed in my presence. The recorded information has been reviewed and considered, and addended by me as needed.   Signed,   Meredith Staggers, MD [facility].  04/27/2016 11:44 AM

## 2017-01-18 ENCOUNTER — Encounter: Payer: Self-pay | Admitting: Emergency Medicine

## 2017-01-18 DIAGNOSIS — J45909 Unspecified asthma, uncomplicated: Secondary | ICD-10-CM | POA: Diagnosis not present

## 2017-01-18 DIAGNOSIS — G43109 Migraine with aura, not intractable, without status migrainosus: Secondary | ICD-10-CM | POA: Insufficient documentation

## 2017-01-18 DIAGNOSIS — R51 Headache: Secondary | ICD-10-CM | POA: Diagnosis present

## 2017-01-18 NOTE — ED Triage Notes (Addendum)
Pt ambulatory to triag with steady gait, no distress noted. Pt c/o headache and photosensitivity x 24hous, has had seasonal allergy symptoms of nasal congestion. Pt denies other symptoms. Denies HX of migraines.

## 2017-01-19 ENCOUNTER — Emergency Department: Payer: BLUE CROSS/BLUE SHIELD

## 2017-01-19 ENCOUNTER — Emergency Department
Admission: EM | Admit: 2017-01-19 | Discharge: 2017-01-19 | Disposition: A | Discharge status: Home / Self Care | Payer: BLUE CROSS/BLUE SHIELD | Attending: Emergency Medicine | Admitting: Emergency Medicine

## 2017-01-19 DIAGNOSIS — R51 Headache: Secondary | ICD-10-CM

## 2017-01-19 DIAGNOSIS — G43109 Migraine with aura, not intractable, without status migrainosus: Secondary | ICD-10-CM

## 2017-01-19 DIAGNOSIS — R519 Headache, unspecified: Secondary | ICD-10-CM

## 2017-01-19 LAB — POCT PREGNANCY, URINE: PREG TEST UR: NEGATIVE

## 2017-01-19 MED ORDER — KETOROLAC TROMETHAMINE 30 MG/ML IJ SOLN
30.0000 mg | Freq: Once | INTRAMUSCULAR | Status: AC
  Administered 2017-01-19: 30 mg via INTRAVENOUS
  Filled 2017-01-19: qty 1

## 2017-01-19 MED ORDER — SODIUM CHLORIDE 0.9 % IV BOLUS (SEPSIS)
1000.0000 mL | Freq: Once | INTRAVENOUS | Status: AC
  Administered 2017-01-19: 1000 mL via INTRAVENOUS

## 2017-01-19 MED ORDER — BUTALBITAL-APAP-CAFFEINE 50-325-40 MG PO TABS: 1.0000 | ORAL_TABLET | Freq: Four times a day (QID) | ORAL | 0 refills | Status: AC | PRN

## 2017-01-19 MED ORDER — DIPHENHYDRAMINE HCL 50 MG/ML IJ SOLN
25.0000 mg | Freq: Once | INTRAMUSCULAR | Status: AC
  Administered 2017-01-19: 25 mg via INTRAVENOUS
  Filled 2017-01-19: qty 1

## 2017-01-19 MED ORDER — METOCLOPRAMIDE HCL 5 MG/ML IJ SOLN
10.0000 mg | Freq: Once | INTRAMUSCULAR | Status: AC
  Administered 2017-01-19: 10 mg via INTRAVENOUS
  Filled 2017-01-19: qty 2

## 2017-01-19 NOTE — ED Notes (Signed)

## 2017-01-19 NOTE — ED Provider Notes (Signed)
[facility] Emergency Department Provider Note   ____________________________________________   First MD Initiated Contact with Patient 01/19/17 [phone]     (approximate)  I have reviewed the triage vital signs and the nursing notes.   HISTORY  Chief Complaint Headache    HPI Angel Mullen is a 26 y.o. female who comes into the hospital today with a concern for a migraine. The patient reports that on the 10th around 4 PM she had a sudden or of light that was pulsating in her visual field. She reports that it moved over towards her right eye. She reports that she started feeling dizzy and then started having a headache. She reports that it was Sunday that day and the light was making her headaches seemed worse. She reports that she had some general weakness. She panicked and drink some water as well as taking some Motrin 600 mg. The patient reports that she went to bed and slept all night. When she woke up the next day the headache was improved but was not gone. She reports that it was still dull but constant. The pain was worse with coughing and she was just feeling very lethargic. She reports that she took 800 mg of ibuprofen and hasn't taken anything since then which was in the morning. She's had some nausea with no vomiting and rates her pain a 4 out of 10 in intensity. The patient does not have a primary care physician here but was concerned so she decided to come into the hospital for evaluation.   Past Medical History:  Diagnosis Date  . Allergy   . Asthma   . PCOS (polycystic ovarian syndrome)     There are no active problems to display for this patient.   Past Surgical History:  Procedure Laterality Date  . extraction of wisdom teeth    . pilondial cyst on tailbone      Prior to Admission medications   Medication Sig Start Date End Date Taking? Authorizing Provider  albuterol (PROVENTIL HFA;VENTOLIN HFA) 108 (90 BASE) MCG/ACT inhaler Inhale 2  puffs into the lungs every 6 (six) hours as needed for wheezing or shortness of breath. Pt prefers pro-air brand 08/17/15   Pearline Cables, MD  butalbital-acetaminophen-caffeine (FIORICET, ESGIC) [phone] MG tablet Take 1-2 tablets by mouth every 6 (six) hours as needed for headache. 01/19/17 01/19/18  Rebecka Apley, MD  cetirizine (ZYRTEC) 10 MG tablet Take 10 mg by mouth daily.    Historical Provider, MD  montelukast (SINGULAIR) 10 MG tablet Take 10 mg by mouth at bedtime.    Historical Provider, MD  Multiple Vitamins-Minerals (MULTIVITAMIN ADULT PO) Take 1 tablet by mouth daily.     Historical Provider, MD  Norgestimate-Ethinyl Estradiol Triphasic (TRINESSA, 28,) 0.18/0.215/0.25 MG-35 MCG tablet Take 1 tablet by mouth daily. 06/13/15   Peyton Najjar, MD    Allergies Amoxicillin  Family History  Problem Relation Age of Onset  . Cancer Other   . Hypertension Other   . Diabetes Other   . Stroke Other     Social History Social History  Substance Use Topics  . Smoking status: Never Smoker  . Smokeless tobacco: Never Used  . Alcohol use 2.4 oz/week    4 Standard drinks or equivalent per week    Review of Systems Constitutional:Fatigue Eyes: No visual changes. ENT: No sore throat. Cardiovascular: Denies chest pain. Respiratory: Denies shortness of breath. Gastrointestinal: Nausea with No abdominal pain, no vomiting.  No diarrhea.  No constipation. Genitourinary: Negative for dysuria. Musculoskeletal: Negative for back pain. Skin: Negative for rash. Neurological: Headache and dizziness  10-point ROS otherwise negative.  ____________________________________________   PHYSICAL EXAM:  VITAL SIGNS: ED Triage Vitals  Enc Vitals Group     BP 01/18/17 2026 (!) 158/89     Pulse Rate 01/18/17 2026 72     Resp 01/18/17 2026 16     Temp 01/18/17 2026 97.6 F (36.4 C)     Temp Source 01/18/17 2026 Oral     SpO2 01/18/17 2026 99 %     Weight 01/18/17 2024 200 lb (90.7 kg)       Height 01/18/17 2024  (1.626 m)     Head Circumference --      Peak Flow --      Pain Score 01/19/17 0058 3     Pain Loc --      Pain Edu? --      Excl. in GC? --     Constitutional: Alert and oriented. Well appearing and in Mild distress. Eyes: Conjunctivae are normal. PERRL. EOMI. Head: Atraumatic. Nose: No congestion/rhinnorhea. Mouth/Throat: Mucous membranes are moist.  Oropharynx non-erythematous. Cardiovascular: Normal rate, regular rhythm. Grossly normal heart sounds.  Good peripheral circulation. Respiratory: Normal respiratory effort.  No retractions. Lungs CTAB. Gastrointestinal: Soft and nontender. No distention. Positive bowel sounds Musculoskeletal: No lower extremity tenderness nor edema.   Neurologic:  Normal speech and language. Cranial nerves II through XII are grossly intact with no focal motor or neuro deficits. Skin:  Skin is warm, dry and intact.  Psychiatric: Mood and affect are normal.   ____________________________________________   LABS (all labs ordered are listed, but only abnormal results are displayed)  Labs Reviewed  PREGNANCY, URINE  POCT PREGNANCY, URINE   ____________________________________________  EKG  none ____________________________________________  RADIOLOGY  CT head ____________________________________________   PROCEDURES  Procedure(s) performed: None  Procedures  Critical Care performed: No  ____________________________________________   INITIAL IMPRESSION / ASSESSMENT AND PLAN / ED COURSE  Pertinent labs & imaging results that were available during my care of the patient were reviewed by me and considered in my medical decision making (see chart for details).  This is a 26 year old who comes into the hospital today with some headache. The patient did have what sounds like a visual aura prior to the onset of the headache. I did give the patient some Reglan, Benadryl, Toradol and a liter of normal saline.  The patient did receive a CT scan of her head which was negative. After the medication the patient reports her headache is gone. Again this does somewhat the patient has developed some migraine with aura. We did discuss a lumbar puncture and as the patient's pain is improved we did not feel it was necessary at this time. The patient should follow-up with neurology though and should return with any worsening symptoms. The patient has no further questions or concerns will be discharged home.  Clinical Course as of Jan 20 324  Thu Jan 19, 2017  0300 Normal head CT. CT Head Wo Contrast [AW]    Clinical Course User Index [AW] Rebecka Apley, MD     ____________________________________________   FINAL CLINICAL IMPRESSION(S) / ED DIAGNOSES  Final diagnoses:  Acute nonintractable headache, unspecified headache type  Migraine with aura and without status migrainosus, not intractable      NEW MEDICATIONS STARTED DURING THIS VISIT:  New Prescriptions   BUTALBITAL-ACETAMINOPHEN-CAFFEINE (FIORICET, ESGIC) 50-325-40 MG TABLET    Take  1-2 tablets by mouth every 6 (six) hours as needed for headache.     Note:  This document was prepared using Dragon voice recognition software and may include unintentional dictation errors.    Rebecka Apley, MD 01/19/17 [phone]

## 2017-01-19 NOTE — Discharge Instructions (Signed)
Please follow-up with the acute care clinic as well as neurology. Please return with any worsening symptoms. We did discuss a lumbar puncture and at this time the decision was made not to perform one. If the headache should worsen or return we will have this discussion again.

## 2017-01-19 NOTE — ED Notes (Signed)
Pt presents to ED 12 c/o headache that started around 1600 on 01/17/17; pt states seeing a circle of light that "kinda faded and went to the corner of my eye" pt states being unable to focus on a meeting after that due to some anxiety; pt does not appear to be in acute distress at this time; pt is alert and oriented x4

## 2019-01-25 IMAGING — CT CT HEAD W/O CM
3 series · 15 of 44 positions shown, 18 images · non-contrast
Comparison: None.

CLINICAL DATA: Headache

EXAM:
CT HEAD WITHOUT CONTRAST
TECHNIQUE: Contiguous axial images were obtained from the base of the skull
through the vertex without intravenous contrast.

[Series 3: head wo · axial · 0.41mm/px · z∈[-81,+29]mm · 9 of 27 slices shown, 12 images]
[im 3/27  brain]
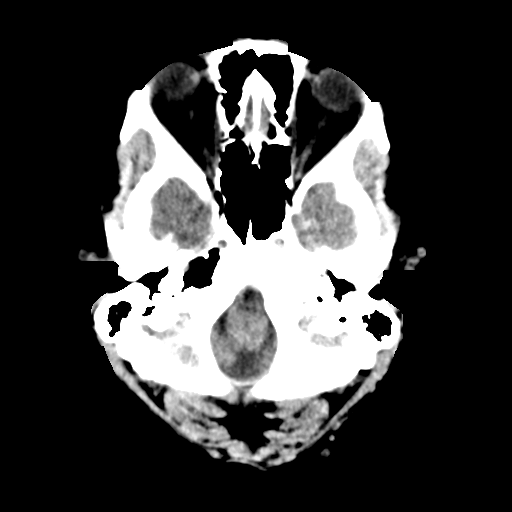
[im 3/27  bone]
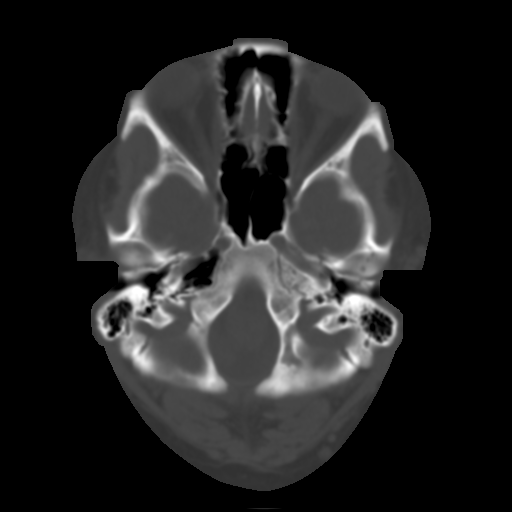
[im 6/27  brain]
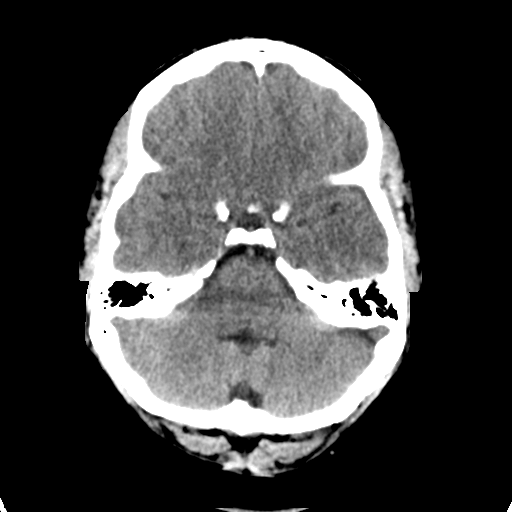
[im 8/27  brain]
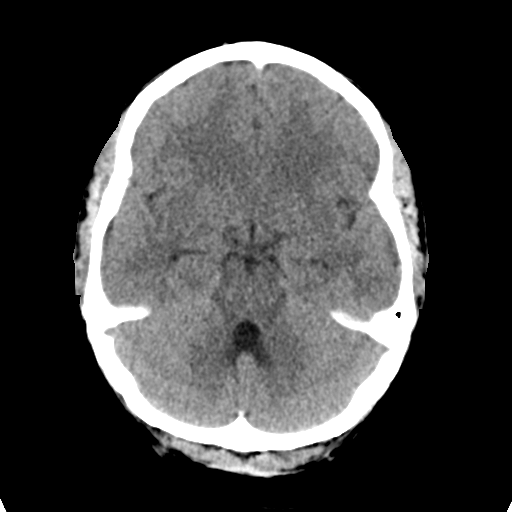
[im 11/27  brain]
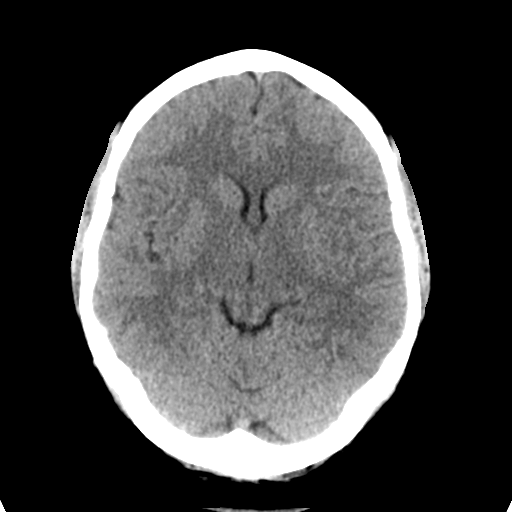
[im 14/27  brain]
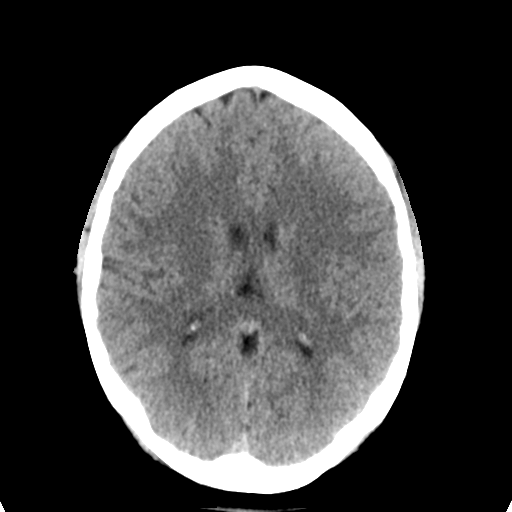
[im 14/27  bone]
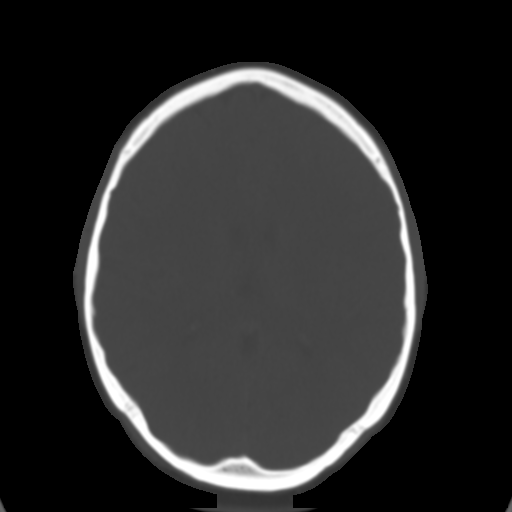
[im 17/27  brain]
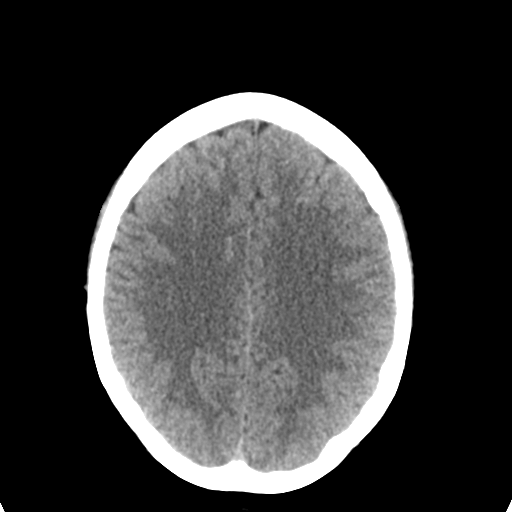
[im 20/27  brain]
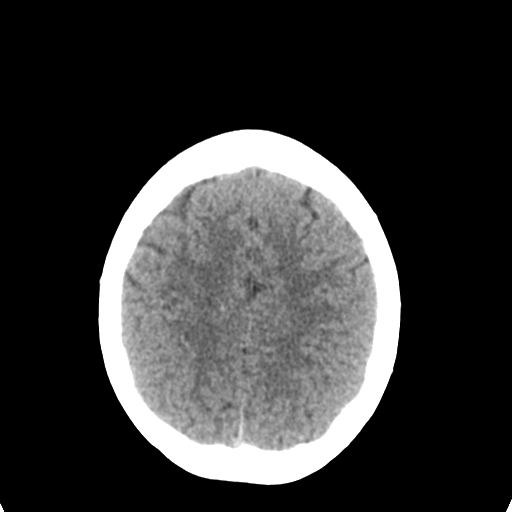
[im 22/27  brain]
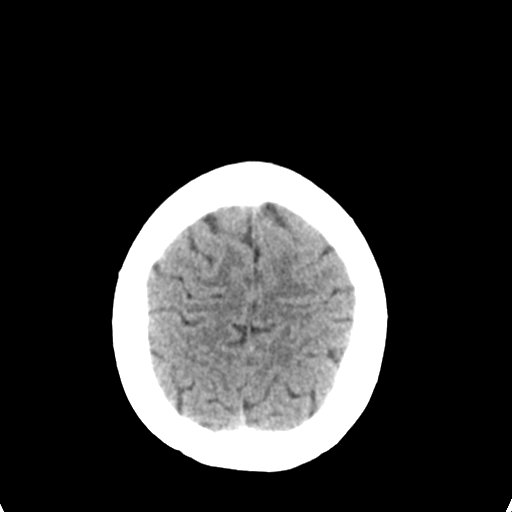
[im 25/27  brain]
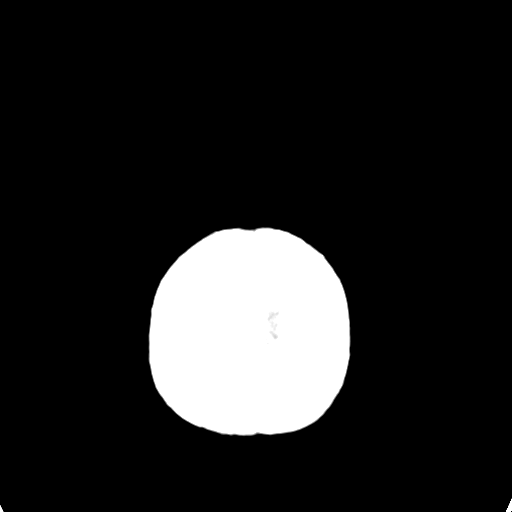
[im 25/27  bone]
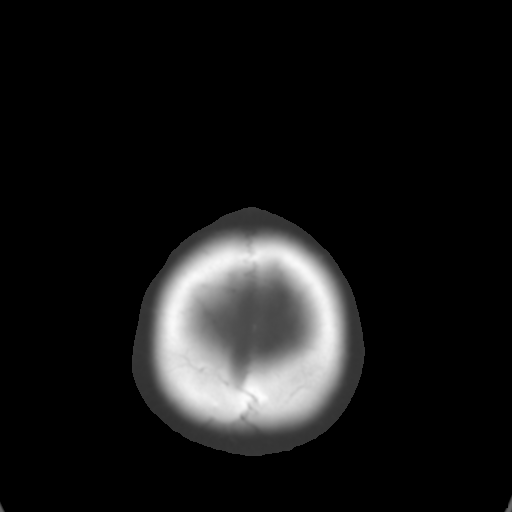

[Series 4: coronal soft tissue · coronal · 0.29mm/px · 3 of 60 slices shown]
[im 20/60  brain]
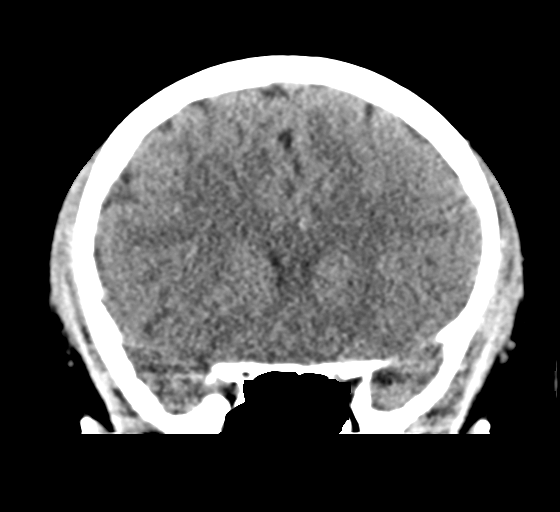
[im 27/60  brain]
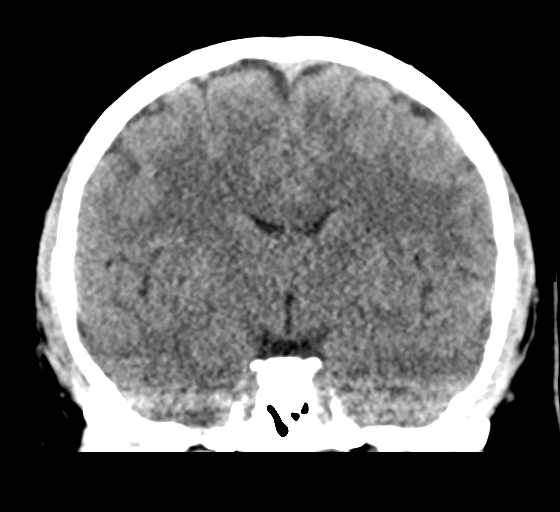
[im 33/60  brain]
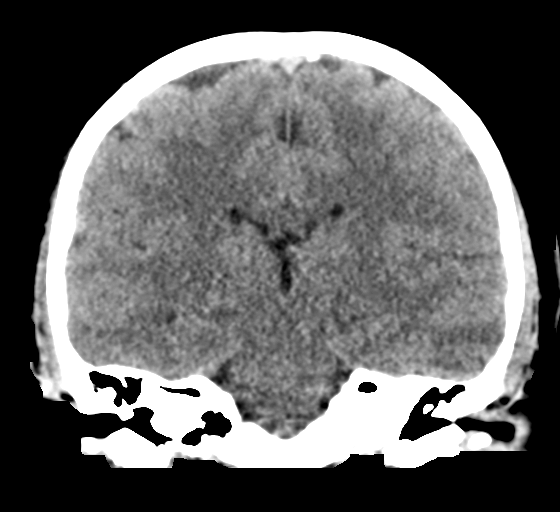

[Series 5: sagittal soft tissue · sagittal · 0.27mm/px · 3 of 55 slices shown]
[im 19/55  brain]
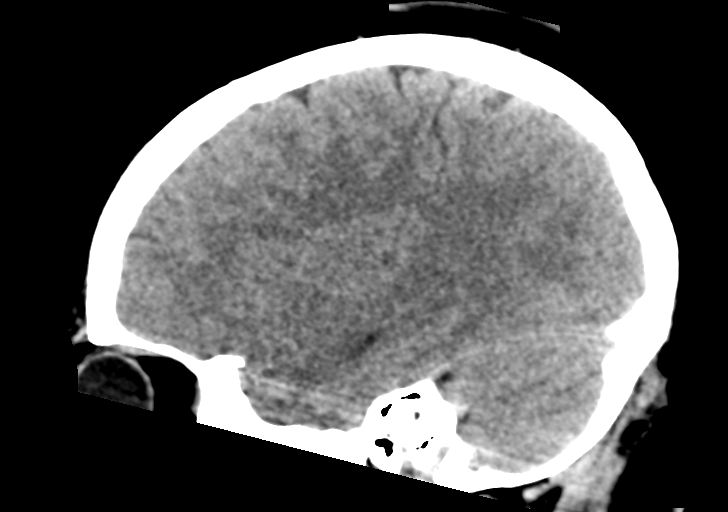
[im 28/55  brain]
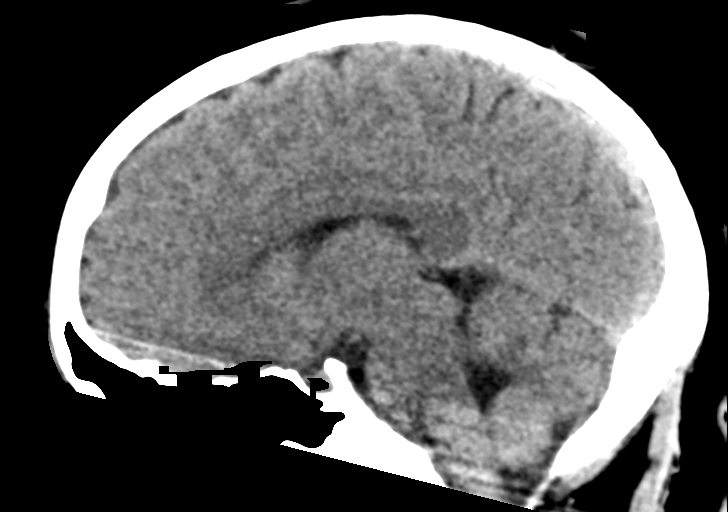
[im 37/55  brain]
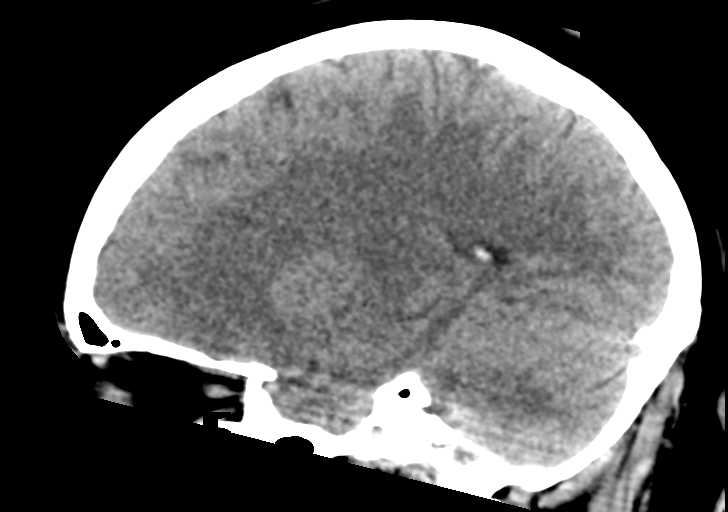

[15 of 44 positions shown; findings below may reference images not displayed]

FINDINGS: Brain: No mass lesion, intraparenchymal hemorrhage or extra-axial
collection. No evidence of acute cortical infarct. Brain parenchyma
and CSF-containing spaces are normal for age.

Vascular: No hyperdense vessel or unexpected calcification.

Skull: Normal visualized skull base, calvarium and extracranial soft
tissues.

Sinuses/Orbits: No sinus fluid levels or advanced mucosal
thickening. No mastoid effusion. Normal orbits.
IMPRESSION: Normal head CT.
